# Patient Record
Sex: Male | Born: 1990 | Race: Black or African American | Hispanic: No | Marital: Single | Smoking: Never smoker
Health system: Southern US, Community
[De-identification: ages and names within clinical notes are randomized; demographics above are authoritative.]

## PROBLEM LIST (undated history)

## (undated) HISTORY — PX: APPENDECTOMY: SHX54

## (undated) HISTORY — PX: HERNIA REPAIR: SHX51

---

## 2014-03-22 ENCOUNTER — Encounter (HOSPITAL_COMMUNITY): Payer: Self-pay | Admitting: Emergency Medicine

## 2014-03-22 ENCOUNTER — Emergency Department (HOSPITAL_COMMUNITY)
Admission: EM | Admit: 2014-03-22 | Discharge: 2014-03-23 | Disposition: A | Discharge status: Home / Self Care | Payer: BC Managed Care – PPO | Attending: Emergency Medicine | Admitting: Emergency Medicine

## 2014-03-22 DIAGNOSIS — R197 Diarrhea, unspecified: Secondary | ICD-10-CM | POA: Diagnosis not present

## 2014-03-22 LAB — CBC WITH DIFFERENTIAL/PLATELET
BASOS PCT: 0 % (ref 0–1)
Basophils Absolute: 0 10*3/uL (ref 0.0–0.1)
Eosinophils Absolute: 0.1 10*3/uL (ref 0.0–0.7)
Eosinophils Relative: 2 % (ref 0–5)
HCT: 41.5 % (ref 39.0–52.0)
Hemoglobin: 13.8 g/dL (ref 13.0–17.0)
Lymphocytes Relative: 30 % (ref 12–46)
Lymphs Abs: 1.5 10*3/uL (ref 0.7–4.0)
MCH: 27.2 pg (ref 26.0–34.0)
MCHC: 33.3 g/dL (ref 30.0–36.0)
MCV: 81.9 fL (ref 78.0–100.0)
Monocytes Absolute: 0.4 10*3/uL (ref 0.1–1.0)
Monocytes Relative: 8 % (ref 3–12)
NEUTROS PCT: 60 % (ref 43–77)
Neutro Abs: 3 10*3/uL (ref 1.7–7.7)
PLATELETS: 199 10*3/uL (ref 150–400)
RBC: 5.07 MIL/uL (ref 4.22–5.81)
RDW: 13.1 % (ref 11.5–15.5)
WBC: 5.1 10*3/uL (ref 4.0–10.5)

## 2014-03-22 NOTE — ED Notes (Signed)
Pt states that he has been having diarrhea x 3 weeks; pt states that he is having 10+ BM's per day; pt states that the stool is liquid and dark in color; pt denies abd pain but states that he will feel a cramp in his abd and then shortly after will have diarrhea; pt denies blood to his stool

## 2014-03-23 LAB — COMPREHENSIVE METABOLIC PANEL
ALBUMIN: 4.1 g/dL (ref 3.5–5.2)
ALK PHOS: 69 U/L (ref 39–117)
ALT: 22 U/L (ref 0–53)
ANION GAP: 13 (ref 5–15)
AST: 22 U/L (ref 0–37)
BUN: 22 mg/dL (ref 6–23)
CO2: 26 mEq/L (ref 19–32)
Calcium: 9.6 mg/dL (ref 8.4–10.5)
Chloride: 100 mEq/L (ref 96–112)
Creatinine, Ser: 1.09 mg/dL (ref 0.50–1.35)
GFR calc Af Amer: >90 mL/min (ref >90)
GFR calc non Af Amer: >90 mL/min (ref >90)
Glucose, Bld: 84 mg/dL (ref 70–99)
Potassium: 4 mEq/L (ref 3.7–5.3)
SODIUM: 139 meq/L (ref 137–147)
Total Bilirubin: 0.3 mg/dL (ref 0.3–1.2)
Total Protein: 7.5 g/dL (ref 6.0–8.3)

## 2014-03-23 LAB — URINALYSIS, ROUTINE W REFLEX MICROSCOPIC
BILIRUBIN URINE: NEGATIVE
Glucose, UA: NEGATIVE mg/dL
Hgb urine dipstick: NEGATIVE
Ketones, ur: NEGATIVE mg/dL
LEUKOCYTES UA: NEGATIVE
Nitrite: NEGATIVE
PH: 5.5 (ref 5.0–8.0)
Protein, ur: NEGATIVE mg/dL
Specific Gravity, Urine: 1.022 (ref 1.005–1.030)
Urobilinogen, UA: 0.2 mg/dL (ref 0.0–1.0)

## 2014-03-23 LAB — LIPASE, BLOOD: Lipase: 22 U/L (ref 11–59)

## 2014-03-23 MED ORDER — SODIUM CHLORIDE 0.9 % IV BOLUS (SEPSIS)
1000.0000 mL | Freq: Once | INTRAVENOUS | Status: AC
  Administered 2014-03-23: 1000 mL via INTRAVENOUS

## 2014-03-23 MED ORDER — DIPHENOXYLATE-ATROPINE 2.5-0.025 MG PO TABS: 1.0000 | ORAL_TABLET | Freq: Four times a day (QID) | ORAL | Status: DC | PRN

## 2014-03-23 NOTE — ED Provider Notes (Signed)
Medical screening examination/treatment/procedure(s) were performed by non-physician practitioner and as supervising physician I was immediately available for consultation/collaboration.   EKG Interpretation None       Olivia Mackielga M Sanam Marmo, MD 03/23/14 [phone]

## 2014-03-23 NOTE — ED Notes (Addendum)
Pt reports diarrhea x 3 weeks now.  States he start to have abd cramping prior to diarrhea.   States he has diarrhea x 10/day.  Denies n/v at this time.  Also  Denies noticing blood in his stool.  However, he reports diarrhea is dark in color.

## 2014-03-23 NOTE — ED Notes (Signed)
Pt thought he was going to have a bowel movement but was unable.

## 2014-03-23 NOTE — Discharge Instructions (Signed)
Return here as needed.  Followup with the doctor provided.  I would suggest taking probiotics and increasing your fiber intake

## 2014-03-23 NOTE — ED Provider Notes (Signed)
CSN: [id number]     Arrival date & time 03/22/14  2223 History   First MD Initiated Contact with Patient 03/22/14 2352     Chief Complaint  Patient presents with  . Diarrhea     (Consider location/radiation/quality/duration/timing/severity/associated sxs/prior Treatment) HPI Patient presents to the emergency department with diarrhea for the last 3 weeks.  The patient, states, that he did not have diarrhea, over the last 4 days and started again today.  Patient, states he has not had any recent travel.  No recent antibiotics.  The last 3 months.  The patient, states, that he has 10+ bowel movement a day.  They are liquidy the patient denies chest pain, shortness of breath, nausea, vomiting, headache, blurred vision, weakness, dizziness, back pain, dysuria, bloody stool, fever, or syncope.  Patient, states he took 2 doses of Imodium with resolution of his symptoms, but they came back.  Patient, states nothing seems to make his condition, better or worse History reviewed. No pertinent past medical history. Past Surgical History  Procedure Laterality Date  . Hernia repair    . Appendectomy     No family history on file. History  Substance Use Topics  . Smoking status: Never Smoker   . Smokeless tobacco: Not on file  . Alcohol Use: Yes     Comment: socially    Review of Systems All other systems negative except as documented in the HPI. All pertinent positives and negatives as reviewed in the HPI.    Allergies  Review of patient's allergies indicates no known allergies.  Home Medications   Prior to Admission medications   Medication Sig Start Date End Date Taking? Authorizing Provider  loperamide (IMODIUM) 2 MG capsule Take 2 mg by mouth as needed for diarrhea or loose stools (diarrhea).   Yes Historical Provider, MD   BP 136/82  Pulse 74  Temp(Src) 98.3 F (36.8 C) (Oral)  Resp 18  SpO2 99% Physical Exam  Nursing note and vitals reviewed. Constitutional: He is oriented to  person, place, and time. He appears well-developed and well-nourished. No distress.  HENT:  Head: Normocephalic and atraumatic.  Mouth/Throat: Oropharynx is clear and moist.  Eyes: Pupils are equal, round, and reactive to light.  Neck: Normal range of motion. Neck supple.  Cardiovascular: Normal rate, regular rhythm and normal heart sounds.  Exam reveals no gallop and no friction rub.   No murmur heard. Pulmonary/Chest: Effort normal and breath sounds normal. No respiratory distress.  Abdominal: Soft. Bowel sounds are normal. He exhibits no distension. There is no tenderness. There is no rebound and no guarding.  Neurological: He is alert and oriented to person, place, and time. He exhibits normal muscle tone. Coordination normal.  Skin: Skin is warm and dry. No erythema.    ED Course  Procedures (including critical care time) Labs Review Labs Reviewed  CBC WITH DIFFERENTIAL  COMPREHENSIVE METABOLIC PANEL  URINALYSIS, ROUTINE W REFLEX MICROSCOPIC  LIPASE, BLOOD  GI PATHOGEN PANEL BY PCR, STOOL    Patient be evaluated for the diarrhea, and given IV fluids.  Patient will most likely need outpatient followup on this at this point.  There is no clear indications as to what's causing his diarrhea   Carlyle DollyChristopher W Morissa Obeirne, PA-C 03/23/14 [phone]

## 2014-04-06 ENCOUNTER — Encounter (HOSPITAL_COMMUNITY): Payer: Self-pay | Admitting: Emergency Medicine

## 2014-04-06 ENCOUNTER — Emergency Department (HOSPITAL_COMMUNITY)
Admission: EM | Admit: 2014-04-06 | Discharge: 2014-04-06 | Disposition: A | Discharge status: Home / Self Care | Payer: BC Managed Care – PPO | Attending: Emergency Medicine | Admitting: Emergency Medicine

## 2014-04-06 DIAGNOSIS — Z791 Long term (current) use of non-steroidal anti-inflammatories (NSAID): Secondary | ICD-10-CM | POA: Insufficient documentation

## 2014-04-06 DIAGNOSIS — J039 Acute tonsillitis, unspecified: Secondary | ICD-10-CM | POA: Insufficient documentation

## 2014-04-06 DIAGNOSIS — R509 Fever, unspecified: Secondary | ICD-10-CM | POA: Insufficient documentation

## 2014-04-06 DIAGNOSIS — R197 Diarrhea, unspecified: Secondary | ICD-10-CM | POA: Insufficient documentation

## 2014-04-06 LAB — RAPID STREP SCREEN (MED CTR MEBANE ONLY): STREPTOCOCCUS, GROUP A SCREEN (DIRECT): NEGATIVE

## 2014-04-06 LAB — CBC WITH DIFFERENTIAL/PLATELET
Basophils Absolute: 0 10*3/uL (ref 0.0–0.1)
Basophils Relative: 0 % (ref 0–1)
Eosinophils Absolute: 0 10*3/uL (ref 0.0–0.7)
Eosinophils Relative: 0 % (ref 0–5)
HCT: 43 % (ref 39.0–52.0)
Hemoglobin: 14.3 g/dL (ref 13.0–17.0)
Lymphocytes Relative: 6 % — ABNORMAL LOW (ref 12–46)
Lymphs Abs: 0.8 10*3/uL (ref 0.7–4.0)
MCH: 27.6 pg (ref 26.0–34.0)
MCHC: 33.3 g/dL (ref 30.0–36.0)
MCV: 82.9 fL (ref 78.0–100.0)
Monocytes Absolute: 1.3 10*3/uL — ABNORMAL HIGH (ref 0.1–1.0)
Monocytes Relative: 10 % (ref 3–12)
Neutro Abs: 11.4 10*3/uL — ABNORMAL HIGH (ref 1.7–7.7)
Neutrophils Relative %: 84 % — ABNORMAL HIGH (ref 43–77)
Platelets: 137 10*3/uL — ABNORMAL LOW (ref 150–400)
RBC: 5.19 MIL/uL (ref 4.22–5.81)
RDW: 13.1 % (ref 11.5–15.5)
WBC: 13.6 10*3/uL — ABNORMAL HIGH (ref 4.0–10.5)

## 2014-04-06 LAB — BASIC METABOLIC PANEL
Anion gap: 14 (ref 5–15)
BUN: 10 mg/dL (ref 6–23)
CHLORIDE: 96 meq/L (ref 96–112)
CO2: 24 mEq/L (ref 19–32)
CREATININE: 1.15 mg/dL (ref 0.50–1.35)
Calcium: 9.1 mg/dL (ref 8.4–10.5)
GFR calc Af Amer: >90 mL/min (ref >90)
GFR calc non Af Amer: 89 mL/min — ABNORMAL LOW (ref >90)
Glucose, Bld: 107 mg/dL — ABNORMAL HIGH (ref 70–99)
Potassium: 3.6 mEq/L — ABNORMAL LOW (ref 3.7–5.3)
Sodium: 134 mEq/L — ABNORMAL LOW (ref 137–147)

## 2014-04-06 MED ORDER — IBUPROFEN 800 MG PO TABS
800.0000 mg | ORAL_TABLET | Freq: Once | ORAL | Status: AC
  Administered 2014-04-06: 800 mg via ORAL
  Filled 2014-04-06: qty 1

## 2014-04-06 MED ORDER — SODIUM CHLORIDE 0.9 % IV BOLUS (SEPSIS)
1000.0000 mL | Freq: Once | INTRAVENOUS | Status: AC
  Administered 2014-04-06: 1000 mL via INTRAVENOUS

## 2014-04-06 MED ORDER — ACETAMINOPHEN 325 MG PO TABS: 650.0000 mg | ORAL_TABLET | Freq: Four times a day (QID) | ORAL | Status: DC | PRN

## 2014-04-06 MED ORDER — ONDANSETRON HCL 4 MG/2ML IJ SOLN
4.0000 mg | Freq: Once | INTRAMUSCULAR | Status: AC
  Administered 2014-04-06: 4 mg via INTRAVENOUS
  Filled 2014-04-06: qty 2

## 2014-04-06 MED ORDER — HYDROCODONE-HOMATROPINE 5-1.5 MG/5ML PO SYRP: 5.0000 mL | ORAL_SOLUTION | Freq: Four times a day (QID) | ORAL | Status: DC | PRN

## 2014-04-06 MED ORDER — DEXAMETHASONE SODIUM PHOSPHATE 10 MG/ML IJ SOLN
20.0000 mg | Freq: Once | INTRAMUSCULAR | Status: AC
  Administered 2014-04-06: 20 mg via INTRAVENOUS
  Filled 2014-04-06: qty 2

## 2014-04-06 MED ORDER — HYDROCODONE-HOMATROPINE 5-1.5 MG/5ML PO SYRP
5.0000 mL | ORAL_SOLUTION | Freq: Once | ORAL | Status: AC
  Administered 2014-04-06: 5 mL via ORAL
  Filled 2014-04-06: qty 5

## 2014-04-06 MED ORDER — IBUPROFEN 800 MG PO TABS: 800.0000 mg | ORAL_TABLET | Freq: Three times a day (TID) | ORAL | Status: DC

## 2014-04-06 NOTE — ED Provider Notes (Signed)
Medical screening examination/treatment/procedure(s) were performed by non-physician practitioner and as supervising physician I was immediately available for consultation/collaboration.   EKG Interpretation None        Megan E Docherty, MD 04/06/14 1919 

## 2014-04-06 NOTE — ED Notes (Signed)
Patient is resting comfortably. 

## 2014-04-06 NOTE — Discharge Instructions (Signed)
Please follow up with your primary care physician in 1-2 days. If you do not have one please call the [facility] number listed above. Please follow up with Dr. Emeline DarlingGore, as needed to schedule a follow up appointment. Please take pain medication and/or muscle relaxants as prescribed and as needed for pain. Please do not drive on narcotic pain medication or on muscle relaxants. Please alternate between Motrin and Tylenol every three hours for fevers and pain. Please read all discharge instructions and return precautions.   Tonsillitis Tonsillitis is an infection of the throat that causes the tonsils to become red, tender, and swollen. Tonsils are collections of lymphoid tissue at the back of the throat. Each tonsil has crevices (crypts). Tonsils help fight nose and throat infections and keep infection from spreading to other parts of the body for the first 18 months of life.  CAUSES Sudden (acute) tonsillitis is usually caused by infection with streptococcal bacteria. Long-lasting (chronic) tonsillitis occurs when the crypts of the tonsils become filled with pieces of food and bacteria, which makes it easy for the tonsils to become repeatedly infected. SYMPTOMS  Symptoms of tonsillitis include:  A sore throat, with possible difficulty swallowing.  White patches on the tonsils.  Fever.  Tiredness.  New episodes of snoring during sleep, when you did not snore before.  Small, foul-smelling, yellowish-white pieces of material (tonsilloliths) that you occasionally cough up or spit out. The tonsilloliths can also cause you to have bad breath. DIAGNOSIS Tonsillitis can be diagnosed through a physical exam. Diagnosis can be confirmed with the results of lab tests, including a throat culture. TREATMENT  The goals of tonsillitis treatment include the reduction of the severity and duration of symptoms and prevention of associated conditions. Symptoms of tonsillitis can be improved with the  use of steroids to reduce the swelling. Tonsillitis caused by bacteria can be treated with antibiotic medicines. Usually, treatment with antibiotic medicines is started before the cause of the tonsillitis is known. However, if it is determined that the cause is not bacterial, antibiotic medicines will not treat the tonsillitis. If attacks of tonsillitis are severe and frequent, your health care provider may recommend surgery to remove the tonsils (tonsillectomy). HOME CARE INSTRUCTIONS   Rest as much as possible and get plenty of sleep.  Drink plenty of fluids. While the throat is very sore, eat soft foods or liquids, such as sherbet, soups, or instant breakfast drinks.  Eat frozen ice pops.  Gargle with a warm or cold liquid to help soothe the throat. Mix 1/4 teaspoon of salt and 1/4 teaspoon of baking soda in 8 oz of water. SEEK MEDICAL CARE IF:   Large, tender lumps develop in your neck.  A rash develops.  A green, yellow-brown, or bloody substance is coughed up.  You are unable to swallow liquids or food for 24 hours.  You notice that only one of the tonsils is swollen. SEEK IMMEDIATE MEDICAL CARE IF:   You develop any new symptoms such as vomiting, severe headache, stiff neck, chest pain, or trouble breathing or swallowing.  You have severe throat pain along with drooling or voice changes.  You have severe pain, unrelieved with recommended medications.  You are unable to fully open the mouth.  You develop redness, swelling, or severe pain anywhere in the neck.  You have a fever. MAKE SURE YOU:   Understand these instructions.  Will watch your condition.  Will get help right away if you are not doing  well or get worse. Document Released: 06/10/2005 Document Revised: 01/15/2014 Document Reviewed: 02/17/2013 [facility] Patient Information 2015 [place], [place]. This information is not intended to replace advice given to you by your health care provider. Make sure you discuss  any questions you have with your health care provider.

## 2014-04-06 NOTE — ED Notes (Signed)
Pt reports seen in ED on 7/9 for diarrhea, given meds and resolved. Reports right sided throat and neck pain 10/10 that started yesterday along with diarrhea. Reports nausea, denies vomiting.

## 2014-04-06 NOTE — ED Notes (Signed)
Piepenbrink, PA at bedside.  

## 2014-04-06 NOTE — ED Provider Notes (Signed)
CSN: [id number]     Arrival date & time 04/06/14  0827 History   First MD Initiated Contact with Patient 04/06/14 0848     Chief Complaint  Patient presents with  . Sore Throat  . Diarrhea     (Consider location/radiation/quality/duration/timing/severity/associated sxs/prior Treatment) HPI Comments: Patient is a 23 yo M presenting to the ED for a constant severe sore throat that started yesterday with associated fevers, chills, cervical adenopathy. Patient also endorses improvement of diarrhea from visit four days ago, only endorses a few episodes of non-bloody diarrhea. Alleviating factors: none. Aggravating factors: eating, drinking. Medications tried prior to arrival: Dayquil. Denies any nausea, vomiting, abdominal pain, headache, nasal congestion, rhinorrhea.     Patient is a 23 y.o. male presenting with pharyngitis and diarrhea.  Sore Throat Associated symptoms include chills, a fever and a sore throat. Pertinent negatives include no abdominal pain, nausea or vomiting.  Diarrhea Associated symptoms: chills and fever   Associated symptoms: no abdominal pain and no vomiting     History reviewed. No pertinent past medical history. Past Surgical History  Procedure Laterality Date  . Hernia repair    . Appendectomy     History reviewed. No pertinent family history. History  Substance Use Topics  . Smoking status: Never Smoker   . Smokeless tobacco: Not on file  . Alcohol Use: Yes     Comment: socially    Review of Systems  Constitutional: Positive for fever and chills.  HENT: Positive for sore throat.   Gastrointestinal: Positive for diarrhea. Negative for nausea, vomiting and abdominal pain.  All other systems reviewed and are negative.     Allergies  Review of patient's allergies indicates no known allergies.  Home Medications   Prior to Admission medications   Medication Sig Start Date End Date Taking? Authorizing Provider  diphenoxylate-atropine (LOMOTIL)  2.5-0.025 MG per tablet Take 1 tablet by mouth 4 (four) times daily as needed for diarrhea or loose stools. 03/23/14  Yes Christopher W Lawyer, PA-C  DM-Phenylephrine-Acetaminophen (VICKS DAYQUIL COLD & FLU) 10-5-325 MG CAPS Take 2 capsules by mouth once.   Yes Historical Provider, MD  ibuprofen (ADVIL,MOTRIN) 200 MG tablet Take 400 mg by mouth every 6 (six) hours as needed for headache or moderate pain.   Yes Historical Provider, MD  Menthol (COUGH DROPS) 10 MG LOZG Use as directed 1 each in the mouth or throat as needed (for sore throat and cough).   Yes Historical Provider, MD  acetaminophen (TYLENOL) 325 MG tablet Take 2 tablets (650 mg total) by mouth every 6 (six) hours as needed. 04/06/14   Kennidi Yoshida L Naarah Borgerding, PA-C  HYDROcodone-homatropine (HYCODAN) 5-1.5 MG/5ML syrup Take 5 mLs by mouth every 6 (six) hours as needed for cough. 04/06/14   Elke Holtry L Myesha Stillion, PA-C  ibuprofen (ADVIL,MOTRIN) 800 MG tablet Take 1 tablet (800 mg total) by mouth 3 (three) times daily. 04/06/14   Devora Tortorella L Mida Cory, PA-C   BP 131/63  Pulse 92  Temp(Src) 98.3 F (36.8 C) (Oral)  Resp 16  SpO2 97% Physical Exam  Nursing note and vitals reviewed. Constitutional: He is oriented to person, place, and time. He appears well-developed and well-nourished. No distress.  HENT:  Head: Normocephalic and atraumatic.  Right Ear: External ear normal.  Left Ear: External ear normal.  Nose: Nose normal.  Mouth/Throat: Uvula is midline and mucous membranes are normal. No trismus in the jaw. No uvula swelling. Oropharyngeal exudate and posterior oropharyngeal erythema present. No tonsillar abscesses.  Bilateral tonsillar  swelling.   Eyes: Conjunctivae are normal.  Neck: Normal range of motion. Neck supple.  Cardiovascular: Normal rate, regular rhythm and normal heart sounds.   Pulmonary/Chest: Effort normal and breath sounds normal. No stridor. No respiratory distress.  Abdominal: Soft. There is no tenderness.   Musculoskeletal: Normal range of motion.  Lymphadenopathy:    He has cervical adenopathy.  Neurological: He is alert and oriented to person, place, and time.  Skin: Skin is warm and dry. He is not diaphoretic.  Psychiatric: He has a normal mood and affect.    ED Course  Procedures (including critical care time) Medications  ibuprofen (ADVIL,MOTRIN) tablet 800 mg (800 mg Oral Given 04/06/14 0941)  sodium chloride 0.9 % bolus 1,000 mL (0 mLs Intravenous Stopped 04/06/14 1147)  dexamethasone (DECADRON) injection 20 mg (20 mg Intravenous Given 04/06/14 0955)  ondansetron (ZOFRAN) injection 4 mg (4 mg Intravenous Given 04/06/14 0955)  HYDROcodone-homatropine (HYCODAN) 5-1.5 MG/5ML syrup 5 mL (5 mLs Oral Given 04/06/14 1145)    Labs Review Labs Reviewed  CBC WITH DIFFERENTIAL - Abnormal; Notable for the following:    WBC 13.6 (*)    Platelets 137 (*)    Neutrophils Relative % 84 (*)    Neutro Abs 11.4 (*)    Lymphocytes Relative 6 (*)    Monocytes Absolute 1.3 (*)    All other components within normal limits  BASIC METABOLIC PANEL - Abnormal; Notable for the following:    Sodium 134 (*)    Potassium 3.6 (*)    Glucose, Bld 107 (*)    GFR calc non Af Amer 89 (*)    All other components within normal limits  RAPID STREP SCREEN  CULTURE, GROUP A STREP    Imaging Review No results found.   EKG Interpretation None      11:27 AM Patient endorses improvement in his symptoms.   MDM   Final diagnoses:  Tonsillitis    Filed Vitals:   04/06/14 1144  BP: 131/63  Pulse:   Temp: 98.3 F (36.8 C)  Resp: 16    Patient presenting with fever to ED. Pt alert, active, and oriented per age. PE showed bilateral tonsillar swelling, tonsillar exudate, cervical adenopathy. Lungs clear. Abdomen soft, non-tender, non-distended. No meningeal signs. Pt tolerating PO liquids in ED without difficulty. Motrin given and improvement of fever. Decadron, Zofran, and IVF given with improvement of  symptoms.  Advised PCP follow up in 1-2 days. Return precautions discussed. Patient is agreeable to plan. Patient is stable at time of discharge    Jeannetta EllisJennifer L Laiyah Exline, PA-C 04/06/14 1510

## 2014-04-07 LAB — CULTURE, GROUP A STREP

## 2014-08-12 ENCOUNTER — Emergency Department: Payer: Self-pay | Admitting: Emergency Medicine

## 2015-02-02 ENCOUNTER — Emergency Department (HOSPITAL_COMMUNITY)
Admission: EM | Admit: 2015-02-02 | Discharge: 2015-02-02 | Disposition: A | Discharge status: Home / Self Care | Payer: BLUE CROSS/BLUE SHIELD | Attending: Emergency Medicine | Admitting: Emergency Medicine

## 2015-02-02 ENCOUNTER — Encounter (HOSPITAL_COMMUNITY): Payer: Self-pay | Admitting: *Deleted

## 2015-02-02 DIAGNOSIS — J0300 Acute streptococcal tonsillitis, unspecified: Secondary | ICD-10-CM | POA: Diagnosis not present

## 2015-02-02 DIAGNOSIS — R221 Localized swelling, mass and lump, neck: Secondary | ICD-10-CM | POA: Diagnosis present

## 2015-02-02 DIAGNOSIS — R590 Localized enlarged lymph nodes: Secondary | ICD-10-CM

## 2015-02-02 LAB — RAPID STREP SCREEN (MED CTR MEBANE ONLY): STREPTOCOCCUS, GROUP A SCREEN (DIRECT): POSITIVE — AB

## 2015-02-02 MED ORDER — DEXAMETHASONE SODIUM PHOSPHATE 10 MG/ML IJ SOLN
10.0000 mg | Freq: Once | INTRAMUSCULAR | Status: AC
  Administered 2015-02-02: 10 mg via INTRAMUSCULAR
  Filled 2015-02-02: qty 1

## 2015-02-02 MED ORDER — IBUPROFEN 800 MG PO TABS
800.0000 mg | ORAL_TABLET | Freq: Once | ORAL | Status: AC
  Administered 2015-02-02: 800 mg via ORAL
  Filled 2015-02-02: qty 1

## 2015-02-02 MED ORDER — AMOXICILLIN 500 MG PO CAPS: 500.0000 mg | ORAL_CAPSULE | Freq: Two times a day (BID) | ORAL | Status: DC

## 2015-02-02 MED ORDER — IBUPROFEN 800 MG PO TABS: 800.0000 mg | ORAL_TABLET | Freq: Three times a day (TID) | ORAL | Status: DC

## 2015-02-02 NOTE — ED Notes (Addendum)
Pt reports "knot" on L neck, progressively worsening since Thursday. Mild swelling noted to lymphnode on L neck, upon examination, pt endorses R sided tenderness as well. Denies recent sore throat, infection. Hurts worse when swallowing, coughing or upon palpation. No external signs of cyst or the like.

## 2015-02-02 NOTE — ED Provider Notes (Signed)
CSN: [id number]     Arrival date & time 02/02/15  0908 History   First MD Initiated Contact with Patient 02/02/15 [phone]     Chief Complaint  Patient presents with  . "knot on neck"      (Consider location/radiation/quality/duration/timing/severity/associated sxs/prior Treatment) HPI Nicholas Terrell is a 24 year old male with no past medical history presents the ER complaining of a knot on his anterior left neck, sore throat and mild dysphagia over the past 3 days. Patient reports gradual onset of symptoms, and worsening of symptoms over the past several days. Patient denies taking any medications to help his symptoms. Patient reports associated mild headache. Patient states he is able to swallow, however it is just painful to swallow. Patient denies any associated cough, nasal congestion, neck pain, fever, chills, dramatic weight loss, shortness of breath, chest pain, nausea, vomiting.  History reviewed. No pertinent past medical history. Past Surgical History  Procedure Laterality Date  . Hernia repair    . Appendectomy     No family history on file. History  Substance Use Topics  . Smoking status: Never Smoker   . Smokeless tobacco: Not on file  . Alcohol Use: Yes     Comment: socially/occasionally    Review of Systems  Constitutional: Negative for fever.  HENT: Positive for sore throat. Negative for congestion, trouble swallowing and voice change.   Eyes: Negative for visual disturbance.  Respiratory: Negative for shortness of breath.   Cardiovascular: Negative for chest pain.  Gastrointestinal: Negative for nausea, vomiting and abdominal pain.  Genitourinary: Negative for dysuria.  Skin: Negative for rash.  Neurological: Negative for dizziness, syncope, weakness and numbness.  Psychiatric/Behavioral: Negative.       Allergies  Review of patient's allergies indicates no known allergies.  Home Medications   Prior to Admission medications   Medication Sig Start Date  End Date Taking? Authorizing Provider  acetaminophen (TYLENOL) 325 MG tablet Take 2 tablets (650 mg total) by mouth every 6 (six) hours as needed. Patient not taking: Reported on 02/02/2015 04/06/14   Francee PiccoloJennifer Piepenbrink, PA-C  amoxicillin (AMOXIL) 500 MG capsule Take 1 capsule (500 mg total) by mouth 2 (two) times daily. 02/02/15   Ladona MowJoe Arriyah Madej, PA-C  diphenoxylate-atropine (LOMOTIL) 2.5-0.025 MG per tablet Take 1 tablet by mouth 4 (four) times daily as needed for diarrhea or loose stools. Patient not taking: Reported on 02/02/2015 03/23/14   Charlestine Nighthristopher Lawyer, PA-C  HYDROcodone-homatropine [facility](HYCODAN) 5-1.5 MG/5ML syrup Take 5 mLs by mouth every 6 (six) hours as needed for cough. Patient not taking: Reported on 02/02/2015 04/06/14   Francee PiccoloJennifer Piepenbrink, PA-C  ibuprofen (ADVIL,MOTRIN) 800 MG tablet Take 1 tablet (800 mg total) by mouth 3 (three) times daily. 02/02/15   Ladona MowJoe Jarmon Javid, PA-C   BP 135/74 mmHg  Pulse 98  Temp(Src) 98.6 F (37 C) (Oral)  Resp 13  SpO2 96% Physical Exam  Constitutional: He is oriented to person, place, and time. He appears well-developed and well-nourished. No distress.  HENT:  Head: Normocephalic and atraumatic.  Right Ear: Tympanic membrane normal.  Left Ear: Tympanic membrane normal.  Nose: Nose normal.  Mouth/Throat: Uvula is midline. No oral lesions. No trismus in the jaw. No dental abscesses or uvula swelling. Oropharyngeal exudate, posterior oropharyngeal edema and posterior oropharyngeal erythema present. No tonsillar abscesses.  Mild tonsillar enlargement, erythema and exudates noted bilaterally. No PTA. Uvula midline. No trismus.  Eyes: Right eye exhibits no discharge. Left eye exhibits no discharge. No scleral icterus.  Neck: Normal range of motion  and full passive range of motion without pain. Neck supple. No spinous process tenderness and no muscular tenderness present. No rigidity. No edema, no erythema and normal range of motion present. No Brudzinski's sign and  no Kernig's sign noted.  Mild anterior cervical lymphadenopathy  Pulmonary/Chest: Effort normal. No respiratory distress.  Musculoskeletal: Normal range of motion.  Neurological: He is alert and oriented to person, place, and time. He has normal strength. No cranial nerve deficit or sensory deficit. Gait normal. GCS eye subscore is 4. GCS verbal subscore is 5. GCS motor subscore is 6.  Skin: Skin is warm and dry. He is not diaphoretic.  Psychiatric: He has a normal mood and affect.  Nursing note and vitals reviewed.   ED Course  Procedures (including critical care time) Labs Review Labs Reviewed  RAPID STREP SCREEN - Abnormal; Notable for the following:    Streptococcus, Group A Screen (Direct) POSITIVE (*)    All other components within normal limits    Imaging Review No results found.   EKG Interpretation None      MDM   Final diagnoses:  Strep tonsillitis  Anterior cervical lymphadenopathy    Pt febrile with tonsillar exudate, cervical lymphadenopathy, & dysphagia; diagnosis of strep by some physical exam and positive strep test. "Knot" on anterior neck noted to be anterior cervical lymphadenopathy.. Treated in the Ed with steroids, NSAIDs, Pain medication and amoxcicillin.  Pt appears mildly dehydrated, discussed importance of water rehydration. Presentation non concerning for PTA , retropharyngeal abscess or infxn spread to soft tissue. No trismus or uvula deviation. Specific return precautions discussed. Pt able to drink water in ED without difficulty with intact air way. Recommended PCP follow up. Return precautions discussed, patient verbalizes understanding and agreement of this plan.  BP 135/74 mmHg  Pulse 98  Temp(Src) 98.6 F (37 C) (Oral)  Resp 13  SpO2 96%  Signed,  Ladona Mow, PA-C 11:13 AM      Ladona Mow, PA-C 02/02/15 1113  Mancel Bale, MD 02/02/15 [phone]

## 2015-02-02 NOTE — Discharge Instructions (Signed)
Strep Throat °Strep throat is an infection of the throat caused by a bacteria named Streptococcus pyogenes. Your health care provider may call the infection streptococcal "tonsillitis" or "pharyngitis" depending on whether there are signs of inflammation in the tonsils or back of the throat. Strep throat is most common in children aged 24-15 years during the cold months of the year, but it can occur in people of any age during any season. This infection is spread from person to person (contagious) through coughing, sneezing, or other close contact. °SIGNS AND SYMPTOMS  °· Fever or chills. °· Painful, swollen, red tonsils or throat. °· Pain or difficulty when swallowing. °· White or yellow spots on the tonsils or throat. °· Swollen, tender lymph nodes or "glands" of the neck or under the jaw. °· Red rash all over the body (rare). °DIAGNOSIS  °Many different infections can cause the same symptoms. A test must be done to confirm the diagnosis so the right treatment can be given. A "rapid strep test" can help your health care provider make the diagnosis in a few minutes. If this test is not available, a light swab of the infected area can be used for a throat culture test. If a throat culture test is done, results are usually available in a day or two. °TREATMENT  °Strep throat is treated with antibiotic medicine. °HOME CARE INSTRUCTIONS  °· Gargle with 1 tsp of salt in 1 cup of warm water, 3-4 times per day or as needed for comfort. °· Family members who also have a sore throat or fever should be tested for strep throat and treated with antibiotics if they have the strep infection. °· Make sure everyone in your household washes their hands well. °· Do not share food, drinking cups, or personal items that could cause the infection to spread to others. °· You may need to eat a soft food diet until your sore throat gets better. °· Drink enough water and fluids to keep your urine clear or pale yellow. This will help prevent  dehydration. °· Get plenty of rest. °· Stay home from school, day care, or work until you have been on antibiotics for 24 hours. °· Take medicines only as directed by your health care provider. °· Take your antibiotic medicine as directed by your health care provider. Finish it even if you start to feel better. °SEEK MEDICAL CARE IF:  °· The glands in your neck continue to enlarge. °· You develop a rash, cough, or earache. °· You cough up green, yellow-brown, or bloody sputum. °· You have pain or discomfort not controlled by medicines. °· Your problems seem to be getting worse rather than better. °· You have a fever. °SEEK IMMEDIATE MEDICAL CARE IF:  °· You develop any new symptoms such as vomiting, severe headache, stiff or painful neck, chest pain, shortness of breath, or trouble swallowing. °· You develop severe throat pain, drooling, or changes in your voice. °· You develop swelling of the neck, or the skin on the neck becomes red and tender. °· You develop signs of dehydration, such as fatigue, dry mouth, and decreased urination. °· You become increasingly sleepy, or you cannot wake up completely. °MAKE SURE YOU: °· Understand these instructions. °· Will watch your condition. °· Will get help right away if you are not doing well or get worse. °Document Released: 08/28/2000 Document Revised: 01/15/2014 Document Reviewed: 10/30/2010 °ExitCare® Patient Information ©2015 [employer]. This information is not intended to replace advice given to you by   your health care provider. Make sure you discuss any questions you have with your health care provider.   Emergency Department Resource Guide 1) Find a Doctor and Pay Out of Pocket Although you won't have to find out who is covered by your insurance plan, it is a good idea to ask around and get recommendations. You will then need to call the office and see if the doctor you have chosen will accept you as a new patient and what types of options they offer for  patients who are self-pay. Some doctors offer discounts or will set up payment plans for their patients who do not have insurance, but you will need to ask so you aren't surprised when you get to your appointment.  2) Contact Your Local Health Department Not all health departments have doctors that can see patients for sick visits, but many do, so it is worth a call to see if yours does. If you don't know where your local health department is, you can check in your phone book. The CDC also has a tool to help you locate your state's health department, and many state websites also have listings of all of their local health departments.  3) Find a Walk-in Clinic If your illness is not likely to be very severe or complicated, you may want to try a walk in clinic. These are popping up all over the country in pharmacies, drugstores, and shopping centers. They're usually staffed by nurse practitioners or physician assistants that have been trained to treat common illnesses and complaints. They're usually fairly quick and inexpensive. However, if you have serious medical issues or chronic medical problems, these are probably not your best option.  No Primary Care Doctor: - Call Health Connect at  [phone] - they can help you locate a primary care doctor that  accepts your insurance, provides certain services, etc. - Physician Referral Service- [phone]  Chronic Pain Problems: Organization         Address  Phone   Notes  [facility]  [phone] Patients need to be referred by their primary care doctor.   Medication Assistance: Organization         Address  Phone   Notes  [facility] Medication [facility]istance Program [address] [place], [place] [address] [phone] --Must be a resident of [place] [facility] -- Must have NO insurance coverage whatsoever (no Medicaid/ Medicare, etc.) -- The pt. MUST have a primary care doctor that directs their care regularly  and follows them in the community   MedAssist  [phone]   [employer]  [phone]    Agencies that provide inexpensive medical care: Organization         Address  Phone   Notes  [facility]  [phone]   [facility]    [phone]   [facility] [address] [place], [place] [address] [phone]   [facility] [place] [address], [place] [phone]) [phone]   [facility]    [phone]) [phone]   [facility]    [phone]   [facility]  [address], [place] Phone:  [phone]) [phone], Fax:  [phone] Hours of Operation:  9 am - 6 pm, M-F.  Also accepts Medicaid/Medicare and self-pay.  [facility]  [address], [place] Phone: [phone]) [phone], Fax: [phone]. Hours  of Operation:  8:30 am - 5:30 pm, M-F.  Also accepts Medicaid and self-pay.  [facility] [place] [address], [place] Phone: [phone]) [phone]   [facility] [address], [place], [place] [phone], Ext. [phone] Mondays & Thursdays: 7-9 AM.  First 15 patients are seen on a first come, first serve basis.    Medicaid-accepting [facility] [place] Providers:  Organization         Address  Phone   Notes  [facility] [address], [place] [phone] Also accepts self-pay patients.  [facility] : [facility] [address], [place]  [phone]   [facility] [address], [place] [phone]) [phone]   [facility] [address] [place] [address], [place] [phone]   Renaye RakersVeita Bland [address] [place] [address], [place]   [phone] Only accepts [place] Access [place] patients after they have their name applied to their card.   Self-Pay (no insurance) in [place]:  Organization         Address  Phone   Notes  [facility]ickle  Cell Patients, [facility] [address], [place] [phone]   [facility] [address], [place] [phone]   [facility]  [address], [place] [phone]   [facility]/Dr. Osei-Bonsu  [address] [place] [address], [place] or [id number], [place] [phone] Phone number for both [place] and [place] locations is the same.  [facility] [address], [place] [phone]   [facility]  [address] [place] [address], [place] or [address] [phone]) [phone]   [facility] [address], [place] [phone], phone; [phone], fax Sees patients 1st and 3rd Saturday of every month.  Must not qualify for public or private insurance (i.e. Medicaid, Medicare, [place] Health Choice, Veterans' Benefits)  Household income should be no more than 200% of the poverty level The clinic cannot treat you if you are pregnant or think you are pregnant  Sexually transmitted diseases are not treated at the clinic.    Dental Care: Organization         Address  Phone  Notes  [facility] [address], [place] [phone]) [phone] Accepts children up to age 24 who are enrolled in [place]icaid or [place] Health Choice; pregnant women with a Medicaid card; and children who have applied for Medicaid or [place] Health Choice, but were declined, whose parents can pay a reduced fee at time of service.  [facility] [place]  [address], [place] [phone] Accepts children up to age 24 who are enrolled in [place] or [place] Health Choice; pregnant women with a Medicaid card; and children who have applied for Medicaid or [place] Health Choice, but were declined, whose parents can pay a reduced fee at time of service.  [facility]  [address], [place] [phone] Patients are seen by appointment only. Walk-ins are not accepted. [facility] will see patients 24 years of age and older. Monday - Tuesday (8am-5pm) Most Wednesdays (8:30-5pm) $30 per visit, cash only  [facility]  [address], [place] [phone] Patients are  seen by appointment only. Walk-ins are not accepted. [facility] will see patients 618 years of age and older. One Wednesday Evening (Monthly: Volunteer Based).  $30 per visit, cash only  [employer] [facility]  [phone] for adults; Children under age 624, call Graduate Pediatric Dentistry at [phone]. Children aged 164-14, please call [phone] to request a pediatric application.  Dental services are provided in all areas of dental care including fillings, crowns and bridges, complete and partial dentures, implants, gum treatment, root canals, and extractions. Preventive care is also provided. Treatment is provided to both adults and children. Patients are selected via a lottery and there is often a waiting list.   [facility] [address] [place] [address], [place]  [phone]) [phone] [email or web address]   [facility] [address], [place], [place] [phone], Ext. [phone] Second and Fourth Thursday of each month, opens at 6:30 AM; Clinic ends at 9 AM.  Patients are seen on a first-come first-served basis, and a limited number are seen during each clinic.   [facility]  [address], [place], [place] [phone]   Eligibility Requirements You must have lived in [place], [place], or [place] counties for at least the last three months.   You cannot be eligible for state or federal sponsored National Cityhealthcare insurance, including CIGNAVeterans Administration, [place]icaid, or [employer]Medicare.   You generally cannot be eligible for healthcare insurance through your employer.    How to apply: Eligibility  screenings are held every Tuesday and Wednesday afternoon from 1:00 pm until 4:00 pm. You do not need an appointment for the interview!  [facility] [address], [place], [place] [phone]   [facility]  [phone]   [facility]  [phone]   [facility]  [phone]    Behavioral Health Resources in the Community: Intensive Outpatient Programs Organization         Address  Phone  Notes  [facility] [address], [place], [place] [phone]   [facility] [address], [place], [place] [phone]   [facility] [address], [place], [place]  [phone]   [facility] [address],  [place], [place] [phone] or [phone]   Substance Abuse Resources Organization         Address  Phone  Notes  [facility]  [phone]   [facility]  [phone]   [facility]  [phone]   [facility]  [phone]   Residential & Outpatient Substance Abuse Program  [phone]   Psychological Services Organization         Address  Phone  Notes  [facility]  [phone]- [phone]   [facility]  [phone]- [phone]   [facility] [address], [place] [phone] or [phone]    Mobile Crisis Teams Organization         Address  Phone  Notes  [facility], Mobile Crisis Care Unit  [phone]   [facility]  [address] [place], [place] [phone]   Doristine LocksSharon DeEsch [address] [place] [phone]    Self-Help/Support Groups Organization         Address  Phone             Notes  [facility] [place] - variety of support groups  336- [phone] Call for more  information  °Narcotics Anonymous (NA), [facility] [address], °[place]  2 meetings at  this location  ° °Residential Treatment Programs °Organization         Address  Phone  Notes  °[facility] [address],    °[place]  [phone]   °[facility] ° [address], [place], [place] [phone]   °[facility] [address], [place] [phone] Admissions: 8am-3pm M-F  °[facility] [address]    °[place], [place] [phone]   °[facility] [address], [place], [place] [phone]   °[facility] [address]  °[place], [place] [phone]   °[facility] - Intensive Outpatient [address], [place], [place] [phone]   °[facility] ([facility] [address]  °[place], [place] 1-877-615-2722 or [phone]   °[facility] ([facility]) [address] [place], [place] [phone] Accepts Medicaid  °[facility] [address]  °[place] 1-800-659-3381 Substance Abuse/Addiction Treatment  ° °[place] Behavioral Health Resources °Organization         Address  Phone  Notes  °[facility]  (888) [phone]   °Julie Brannon, PhD [address] [place], [place]   (336) [phone] or (336) 951-0000   °[facility]   [address] °[place], [place] (336) [phone]   °[facility] [address], [place], [place] (336) [phone] Insurance/Medicaid/sponsorship through [facility]  °[facility] [address]                                    [place], [place] (336) [phone] Therapy/tele-psych/case  °[facility] [address]  ° [place],  (336) [phone]    °Dr. Arfeen  ([phone]) [phone]   °[facility] [place]  [employer] [place] [facility] 1) [address], [place] °2) [address], [place] °3)  [address], [place] (336) [phone] °(336) [phone] ° °(336) [phone]   °[place] Child Abuse Hotline (336) 342-1394 or (336)  342-3537 (After Hours)    ° ° ° °

## 2016-05-09 DIAGNOSIS — Z79899 Other long term (current) drug therapy: Secondary | ICD-10-CM | POA: Insufficient documentation

## 2016-05-09 DIAGNOSIS — L02211 Cutaneous abscess of abdominal wall: Secondary | ICD-10-CM | POA: Insufficient documentation

## 2016-05-09 DIAGNOSIS — L02213 Cutaneous abscess of chest wall: Secondary | ICD-10-CM | POA: Insufficient documentation

## 2016-05-10 ENCOUNTER — Emergency Department (HOSPITAL_COMMUNITY)
Admission: EM | Admit: 2016-05-10 | Discharge: 2016-05-10 | Disposition: A | Discharge status: Home / Self Care | Payer: BLUE CROSS/BLUE SHIELD | Attending: Emergency Medicine | Admitting: Emergency Medicine

## 2016-05-10 ENCOUNTER — Encounter (HOSPITAL_COMMUNITY): Payer: Self-pay | Admitting: Emergency Medicine

## 2016-05-10 DIAGNOSIS — L02213 Cutaneous abscess of chest wall: Secondary | ICD-10-CM

## 2016-05-10 DIAGNOSIS — L02211 Cutaneous abscess of abdominal wall: Secondary | ICD-10-CM

## 2016-05-10 MED ORDER — LIDOCAINE-EPINEPHRINE (PF) 1 %-1:200000 IJ SOLN
INTRAMUSCULAR | Status: AC
  Filled 2016-05-10: qty 30

## 2016-05-10 MED ORDER — CLINDAMYCIN HCL 150 MG PO CAPS: 300.0000 mg | ORAL_CAPSULE | Freq: Four times a day (QID) | ORAL | 0 refills | Status: AC

## 2016-05-10 MED ORDER — LIDOCAINE-EPINEPHRINE (PF) 2 %-1:200000 IJ SOLN
10.0000 mL | Freq: Once | INTRAMUSCULAR | Status: AC
  Administered 2016-05-10: 10 mL
  Filled 2016-05-10: qty 10

## 2016-05-10 NOTE — ED Triage Notes (Signed)
Pt from home with complaints of redness and pain above his bellybutton and on his left chest. Pt states that this began yesterday. Pt states that on the spot around his belly button, he had some white discharge around an hour ago. Pt states that the areas itched earlier today, but now it itches and hurts.

## 2016-05-10 NOTE — Discharge Instructions (Signed)
Ibuprofen for pain. Warm compresses several times a day. Take clindamycin as prescribed until all gone. In 2 days, pull out the packing. If worsening return to emergency department for reevaluation.

## 2016-05-10 NOTE — ED Provider Notes (Signed)
[facility]-EMERGENCY DEPT Provider Note   CSN: [id number] Arrival date & time: 05/09/16  2349     History   Chief Complaint Chief Complaint  Patient presents with  . Abscess    HPI Nicholas Terrell is a 25 y.o. male.  HPI Nicholas Terrell is a 25 y.o. male presents to emergency department complaining of an abscess to abdominal wall and left upper chest. States abdominal wall abscess started several days ago. States started out as a small pimple, gradually has gotten larger in size with surrounding redness, swelling, pain. Reports small pimple-like abscess to the left upper chest, it is just now becoming tender. Denies any drainage. No treatment prior to coming in. Reports history of abscesses to bilateral thigh, but never to the abdomen or chest wall. Denies any fever or chills. Denies any myalgias or generalized malaise. No other complaints.  History reviewed. No pertinent past medical history.  There are no active problems to display for this patient.   Past Surgical History:  Procedure Laterality Date  . APPENDECTOMY    . HERNIA REPAIR         Home Medications    Prior to Admission medications   Medication Sig Start Date End Date Taking? Authorizing Provider  hydrocortisone cream 0.5 % Apply 1 application topically 2 (two) times daily as needed for itching (to stomach area).   Yes Historical Provider, MD  ibuprofen (ADVIL,MOTRIN) 200 MG tablet Take 400 mg by mouth every 6 (six) hours as needed (for pain.).   Yes Historical Provider, MD  neomycin-bacitracin-polymyxin (NEOSPORIN) [phone] ointment Apply 1 application topically 4 (four) times daily as needed (for wound).   Yes Historical Provider, MD  clindamycin (CLEOCIN) 150 MG capsule Take 2 capsules (300 mg total) by mouth every 6 (six) hours. 05/10/16   Shaquella Stamant, PA-C  clindamycin (CLEOCIN) 150 MG capsule Take 2 capsules (300 mg total) by mouth 4 (four) times daily. 05/10/16   Jaynie Crumble, PA-C    Family  History History reviewed. No pertinent family history.  Social History Social History  Substance Use Topics  . Smoking status: Never Smoker  . Smokeless tobacco: Never Used  . Alcohol use Yes     Comment: socially/occasionally     Allergies   Review of patient's allergies indicates no known allergies.   Review of Systems Review of Systems  Constitutional: Negative for chills and fever.  Skin: Positive for wound.  Neurological: Negative for weakness and numbness.  All other systems reviewed and are negative.    Physical Exam Updated Vital Signs BP 138/76   Pulse 90   Temp 99.1 F (37.3 C) (Oral)   Resp 18   Ht 5\' 11"  (1.803 m)   Wt 107 kg   SpO2 98%   BMI 32.92 kg/m   Physical Exam  Constitutional: He appears well-developed and well-nourished. No distress.  HENT:  Head: Normocephalic and atraumatic.  Eyes: Conjunctivae are normal.  Neck: Neck supple.  Cardiovascular: Normal rate, regular rhythm and normal heart sounds.   Pulmonary/Chest: Effort normal. No respiratory distress. He has no wheezes. He has no rales.  Musculoskeletal: He exhibits no edema.  Neurological: He is alert.  Skin: Skin is warm and dry.     4 x 4 centimeter area of induration to the mid abdomen just above umbilicus with surrounding erythema extending approximately 5 cm in diameter. Tender to palpation. Small pustule to the left upper chest with just approximately 2 cm radius of erythema. Tender to palpation. No drainage.  Nursing  note and vitals reviewed.    ED Treatments / Results  Labs (all labs ordered are listed, but only abnormal results are displayed) Labs Reviewed - No data to display  EKG  EKG Interpretation None       Radiology No results found.  Procedures Procedures (including critical care time)  INCISION AND DRAINAGE Performed by: Jaynie CrumbleKIRICHENKO, Arshia Rondon A Consent: Verbal consent obtained. Risks and benefits: risks, benefits and alternatives were  discussed Type: abscess  Body area: Abdominal wall  Anesthesia: local infiltration  Incision was made with a scalpel.  Local anesthetic: lidocaine 2 % with epinephrine  Anesthetic total: 3 ml  Complexity: complex Blunt dissection to break up loculations  Drainage: purulent  Drainage amount: Large   Packing material: 1/4 in iodoform gauze  Patient tolerance: Patient tolerated the procedure well with no immediate complications.     Medications Ordered in ED Medications  lidocaine-EPINEPHrine (XYLOCAINE-EPINEPHrine) 1 %-1:200000 (PF) injection (  Not Given 05/10/16 0253)  lidocaine-EPINEPHrine (XYLOCAINE W/EPI) 2 %-1:200000 (PF) injection 10 mL (10 mLs Infiltration Given by Other 05/10/16 [id number])     Initial Impression / Assessment and Plan / ED Course  I have reviewed the triage vital signs and the nursing notes.  Pertinent labs & imaging results that were available during my care of the patient were reviewed by me and considered in my medical decision making (see chart for details).  Clinical Course    Patient would to abdominal and chest wall abscesses. Abdominal abscess incised and drained with large purulent drainage. Chest abscesses less than 1 cm, will hold off on drainage at this time. Patient is afebrile, nontoxic appearing. Will start on clindamycin for infection. Follow-up with primary care doctor. Ibuprofen for pain. Instructed to return if worsening.  Vitals:   05/09/16 2351 05/10/16 0001 05/10/16 0345  BP: 140/70 161/81 138/76  Pulse: 92 76 90  Resp: 18 18 18   Temp: 98.6 F (37 C) 99.1 F (37.3 C)   TempSrc: Oral Oral   SpO2: 96% 97% 98%  Weight: 107 kg    Height: 5\' 11"  (1.803 m)       Final Clinical Impressions(s) / ED Diagnoses   Final diagnoses:  Abscess of abdominal wall  Abscess of chest wall    New Prescriptions Discharge Medication List as of 05/10/2016  3:42 AM    START taking these medications   Details  !! clindamycin (CLEOCIN)  150 MG capsule Take 2 capsules (300 mg total) by mouth every 6 (six) hours., Starting Sun 05/10/2016, Print    !! clindamycin (CLEOCIN) 150 MG capsule Take 2 capsules (300 mg total) by mouth 4 (four) times daily., Starting Sun 05/10/2016, Print     !! - Potential duplicate medications found. Please discuss with provider.       Jaynie Crumbleatyana Racquelle Hyser, PA-C 05/10/16 [id number]    Tilden FossaElizabeth Rees, MD 05/11/16 [id number]

## 2019-11-19 ENCOUNTER — Encounter (HOSPITAL_COMMUNITY): Payer: Self-pay

## 2019-11-19 ENCOUNTER — Emergency Department (HOSPITAL_COMMUNITY): Payer: BC Managed Care – PPO

## 2019-11-19 ENCOUNTER — Other Ambulatory Visit: Payer: Self-pay

## 2019-11-19 ENCOUNTER — Emergency Department (HOSPITAL_COMMUNITY)
Admission: EM | Admit: 2019-11-19 | Discharge: 2019-11-19 | Disposition: A | Discharge status: Home / Self Care | Payer: BC Managed Care – PPO | Attending: Emergency Medicine | Admitting: Emergency Medicine

## 2019-11-19 DIAGNOSIS — M79671 Pain in right foot: Secondary | ICD-10-CM | POA: Insufficient documentation

## 2019-11-19 DIAGNOSIS — Z79899 Other long term (current) drug therapy: Secondary | ICD-10-CM | POA: Diagnosis not present

## 2019-11-19 MED ORDER — ACETAMINOPHEN 500 MG PO TABS
1000.0000 mg | ORAL_TABLET | Freq: Once | ORAL | Status: AC
  Administered 2019-11-19: 1000 mg via ORAL
  Filled 2019-11-19: qty 2

## 2019-11-19 MED ORDER — IBUPROFEN 200 MG PO TABS
400.0000 mg | ORAL_TABLET | Freq: Once | ORAL | Status: AC
  Administered 2019-11-19: 400 mg via ORAL
  Filled 2019-11-19: qty 2

## 2019-11-19 MED ORDER — NAPROXEN 500 MG PO TABS
500.0000 mg | ORAL_TABLET | Freq: Two times a day (BID) | ORAL | 0 refills | Status: AC
Start: 2019-11-19 — End: ?

## 2019-11-19 NOTE — Discharge Instructions (Signed)
You may have a stress fracture of your foot.  Avoid high impact activities.  Use the crutches for the next week and avoid a lot of pressure on her foot.  However if it is still hurting you you need to follow-up with the orthopedist.

## 2019-11-19 NOTE — ED Triage Notes (Signed)
Patient was out to eat last night and by the time he stood up to leave the top of his R foot started hurting. 10/10 pain. Patient identifies no known injuries. Pain has progressively gotten worse into today and patient can no longer walk on foot. Patient state that "it hurts to touch and to move foot". No obvious deformity or swelling.

## 2019-11-19 NOTE — ED Provider Notes (Signed)
[facility]-EMERGENCY DEPT Provider Note   CSN: [id number] Arrival date & time: 11/19/19  0756     History No chief complaint on file.   Ja Ohman is a 29 y.o. male.  Patient is a 29 year old male with no significant past medical history presenting today with right foot pain.  Patient reports it started hurting last night after he got done eating at a restaurant.  The pain is sharp and stabbing.  It is in the lateral portion of his right foot and more severe with walking.  It was hurting pretty badly last night but he thought it might improve after he rested and elevated it.  However this morning the pain is still 8 out of 10 when he attempts to walk and he is having difficulty walking.  He denies any ankle pain, injury or notable swelling.  He has no pain further up into his leg.  He states he has had some mild pain there in the past but nothing that has ever been this severe or stayed this long.  The history is provided by the patient.       History reviewed. No pertinent past medical history.  There are no problems to display for this patient.   Past Surgical History:  Procedure Laterality Date  . APPENDECTOMY    . HERNIA REPAIR         No family history on file.  Social History   Tobacco Use  . Smoking status: Never Smoker  . Smokeless tobacco: Never Used  Substance Use Topics  . Alcohol use: Yes    Comment: socially/occasionally  . Drug use: No    Home Medications Prior to Admission medications   Medication Sig Start Date End Date Taking? Authorizing Provider  clindamycin (CLEOCIN) 150 MG capsule Take 2 capsules (300 mg total) by mouth every 6 (six) hours. 05/10/16   Kirichenko, Tatyana, PA-C  clindamycin (CLEOCIN) 150 MG capsule Take 2 capsules (300 mg total) by mouth 4 (four) times daily. 05/10/16   Kirichenko, Lemont Fillers, PA-C  hydrocortisone cream 0.5 % Apply 1 application topically 2 (two) times daily as needed for itching (to stomach  area).    [provider]  ibuprofen (ADVIL,MOTRIN) 200 MG tablet Take 400 mg by mouth every 6 (six) hours as needed (for pain.).    [provider]  neomycin-bacitracin-polymyxin (NEOSPORIN) [phone] ointment Apply 1 application topically 4 (four) times daily as needed (for wound).    [provider]    Allergies    Patient has no known allergies.  Review of Systems   Review of Systems  All other systems reviewed and are negative.   Physical Exam Updated Vital Signs BP (!) 148/96 (BP Location: Right Arm)   Pulse 85   Temp 98.2 F (36.8 C) (Oral)   Resp 16   Ht 6' (1.829 m)   Wt 136.1 kg   SpO2 97%   BMI 40.69 kg/m   Physical Exam Vitals and nursing note reviewed.  Constitutional:      General: He is not in acute distress.    Appearance: Normal appearance. He is obese.  HENT:     Head: Normocephalic.  Cardiovascular:     Rate and Rhythm: Normal rate.  Pulmonary:     Effort: Pulmonary effort is normal.  Musculoskeletal:        General: Tenderness present.     Right ankle: Normal.     Right Achilles Tendon: Normal.     Right foot: Bony  tenderness present.       Legs:  Skin:    General: Skin is warm.     Capillary Refill: Capillary refill takes less than 2 seconds.  Neurological:     General: No focal deficit present.     Mental Status: He is alert.  Psychiatric:        Mood and Affect: Mood normal.        Behavior: Behavior normal.        Thought Content: Thought content normal.     ED Results / Procedures / Treatments   Labs (all labs ordered are listed, but only abnormal results are displayed) Labs Reviewed - No data to display  EKG None  Radiology DG Foot Complete Right  Result Date: 11/19/2019 CLINICAL DATA:  29 year old acute onset of RIGHT foot pain yesterday when standing after dinner, progressively worsening over night. No known injury. EXAM: RIGHT FOOT COMPLETE - 3+ VIEW COMPARISON:  None. FINDINGS: No evidence of  acute fracture or dislocation. Joint spaces well preserved. Well-preserved bone mineral density. No intrinsic osseous abnormalities. IMPRESSION: Normal examination. Electronically Signed   By: Evangeline Dakin M.D.   On: 11/19/2019 08:52    Procedures Procedures (including critical care time)  Medications Ordered in ED Medications  ibuprofen (ADVIL) tablet 400 mg (has no administration in time range)  acetaminophen (TYLENOL) tablet 1,000 mg (has no administration in time range)    ED Course  I have reviewed the triage vital signs and the nursing notes.  Pertinent labs & imaging results that were available during my care of the patient were reviewed by me and considered in my medical decision making (see chart for details).    MDM Rules/Calculators/A&P                      Patient presenting with pain to the area of the fifth metatarsal that started last night.  No known injury but significant tenderness with palpation.  No evidence of gout or ankle abnormalities.  Low suspicion for DVT.  No signs of cellulitis or infection.  X-rays are pending and patient given pain control.  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  Concern for possible stress fracture.  Patient placed in a postop shoe and given crutches and anti-inflammatories.  Encouraged to follow-up with orthopedics if not improving  Final Clinical Impression(s) / ED Diagnoses Final diagnoses:  None    Rx / DC Orders ED Discharge Orders         Ordered    naproxen (NAPROSYN) 500 MG tablet  2 times daily     11/19/19 6789           Blanchie Dessert, MD 11/19/19 [phone]

## 2020-10-24 ENCOUNTER — Other Ambulatory Visit (HOSPITAL_COMMUNITY): Payer: Self-pay | Admitting: General Surgery

## 2020-10-24 ENCOUNTER — Other Ambulatory Visit: Payer: Self-pay | Admitting: General Surgery

## 2021-09-22 IMAGING — CR DG FOOT COMPLETE 3+V*R*
3 series · 3 of 3 positions shown · non-contrast
Comparison: None.

CLINICAL DATA: 28-year-old acute onset of RIGHT foot pain yesterday
when standing after dinner, progressively worsening over night. No
known injury.

EXAM:
RIGHT FOOT COMPLETE - 3+ VIEW

[x foot ap right]
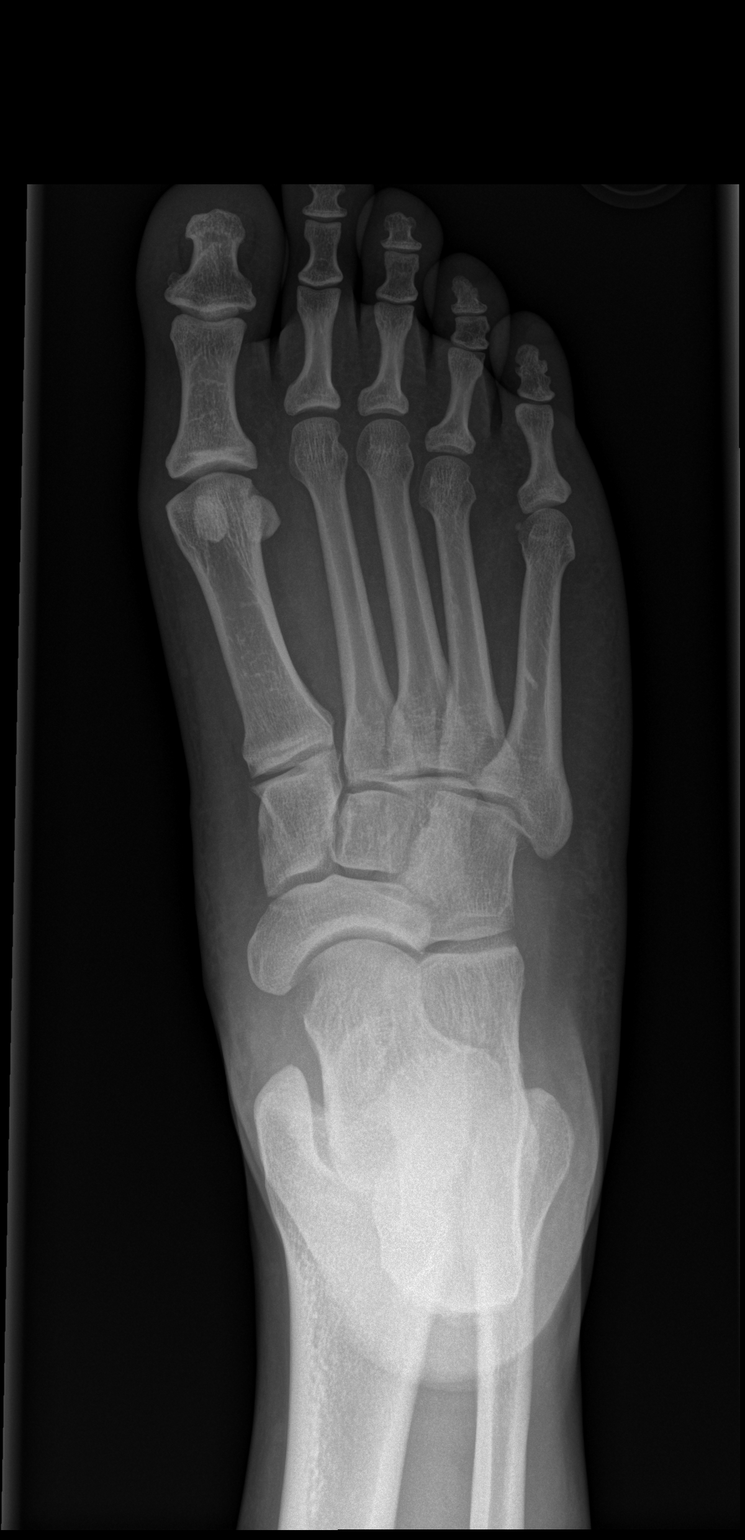

[x foot obl right]
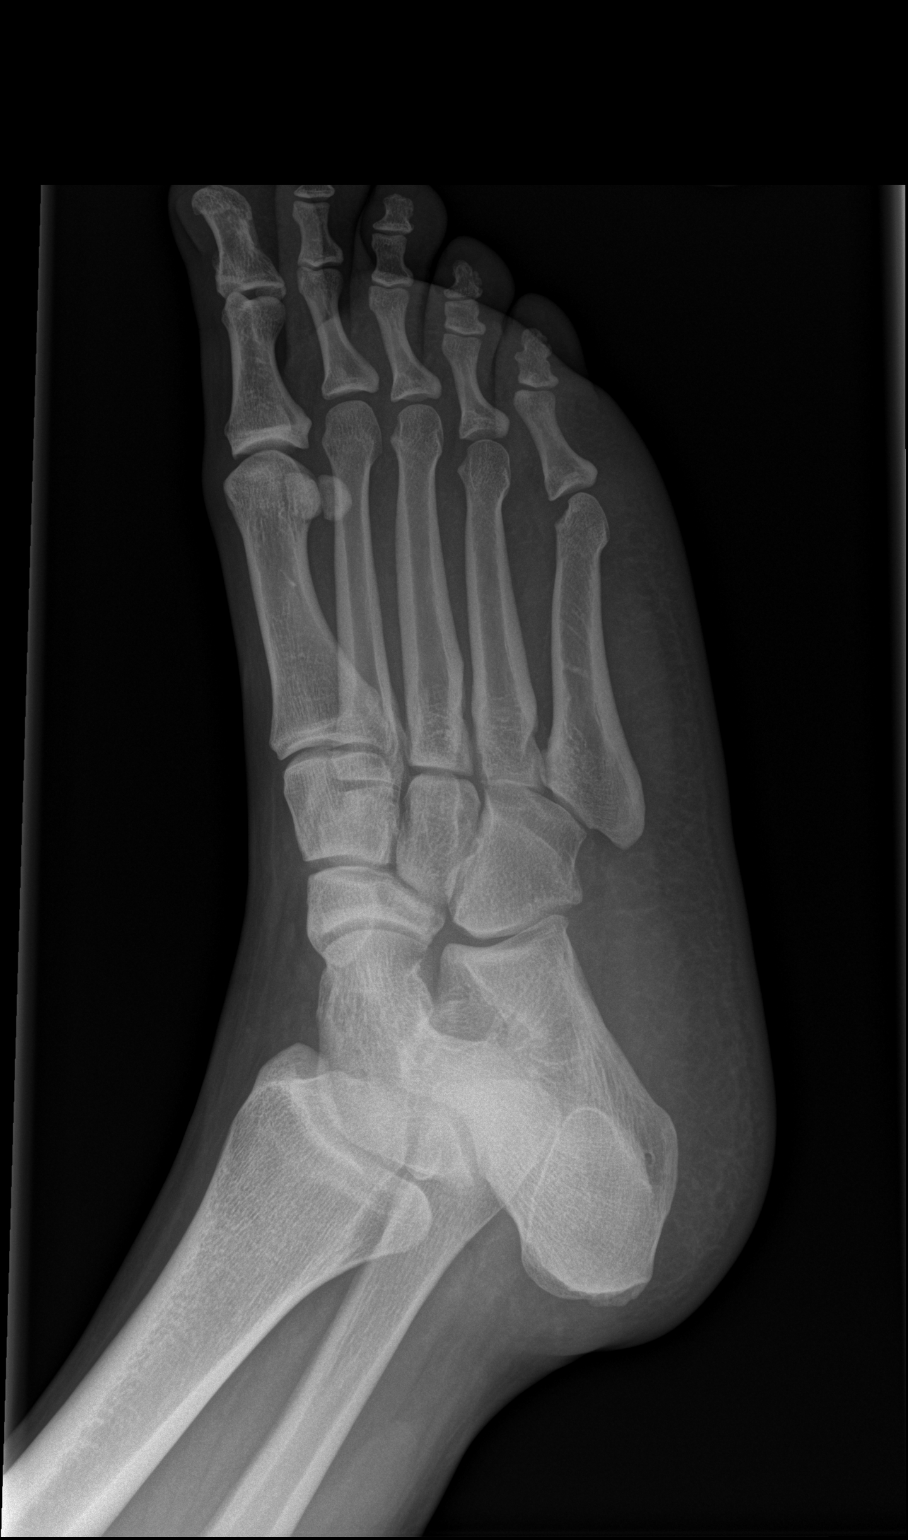

[x foot lat right]
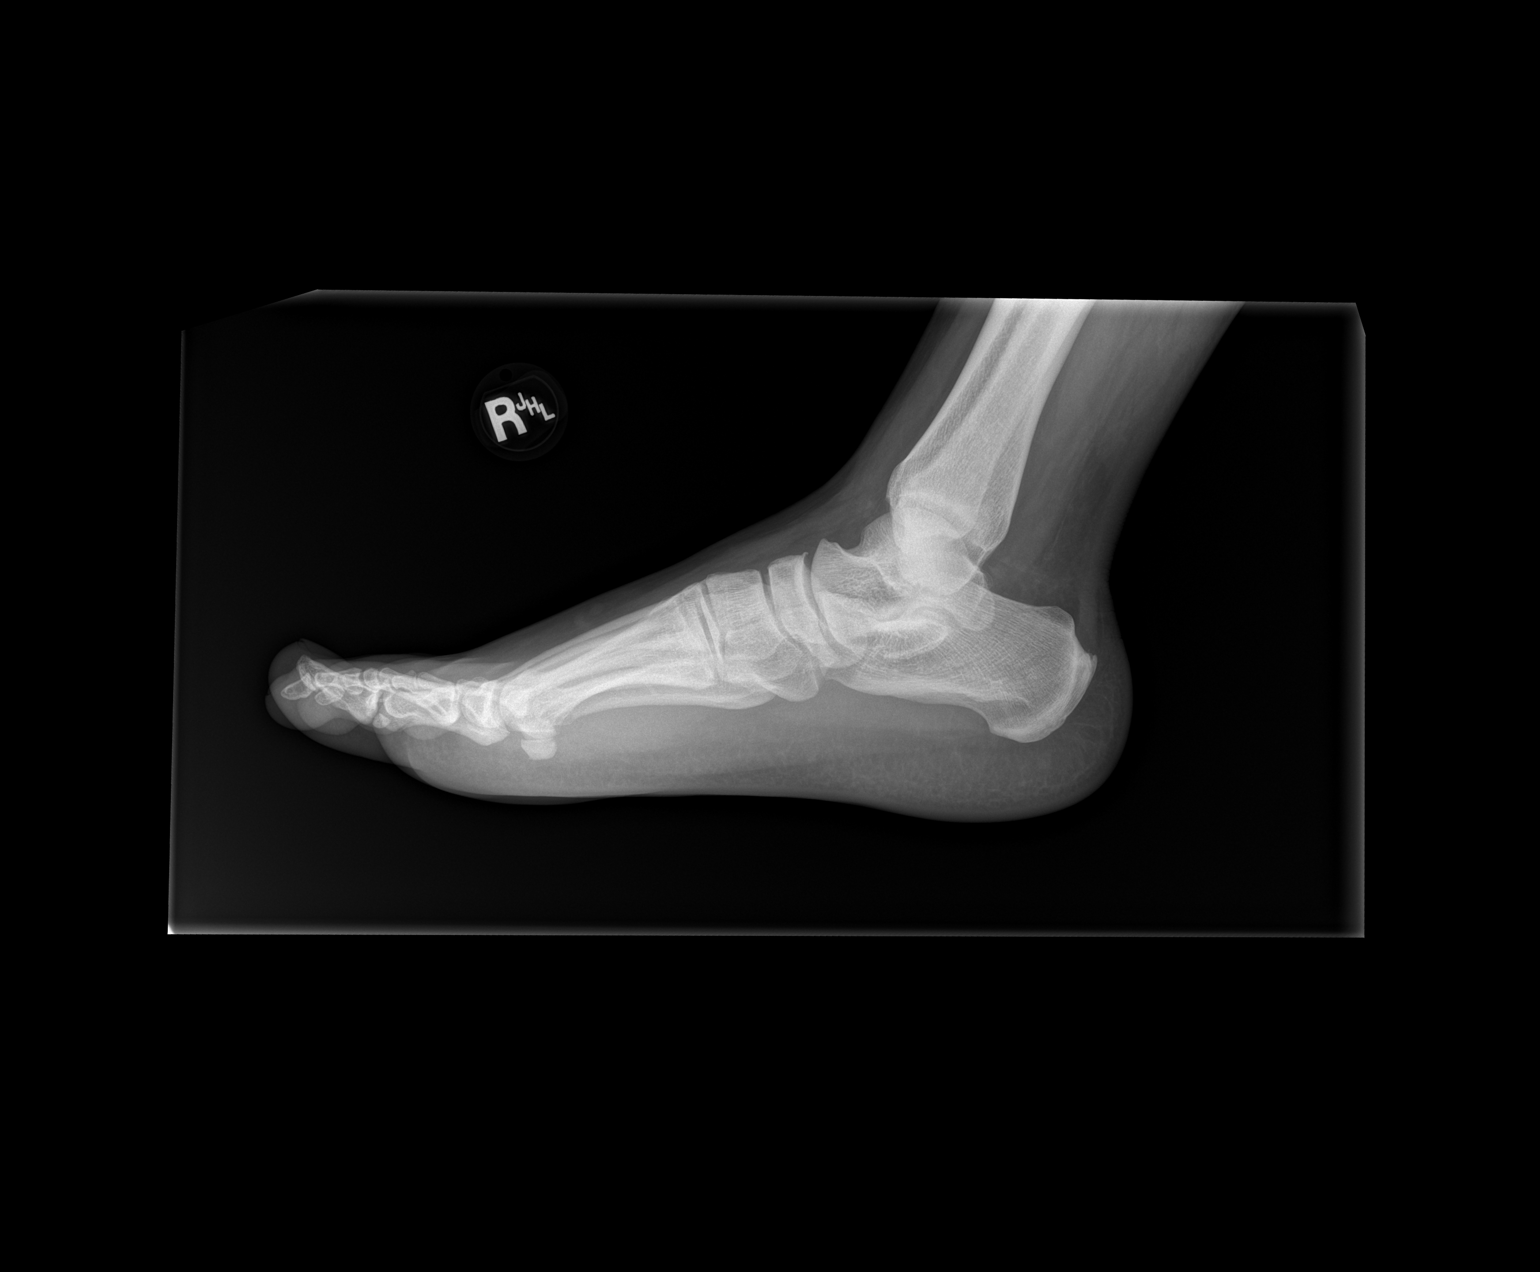

[3 of 3 positions shown; findings below may reference images not displayed]

FINDINGS: No evidence of acute fracture or dislocation. Joint spaces well
preserved. Well-preserved bone mineral density. No intrinsic osseous
abnormalities.
IMPRESSION: Normal examination.
